# Patient Record
Sex: Male | Born: 1995 | Race: Black or African American | Hispanic: No | Marital: Single | Smoking: Current every day smoker
Health system: Southern US, Community
[De-identification: ages and names within clinical notes are randomized; demographics above are authoritative.]

## PROBLEM LIST (undated history)

## (undated) DIAGNOSIS — N289 Disorder of kidney and ureter, unspecified: Secondary | ICD-10-CM

## (undated) HISTORY — PX: EAR TUBE REMOVAL: SHX1486

---

## 2017-12-03 ENCOUNTER — Other Ambulatory Visit: Payer: Self-pay

## 2017-12-03 ENCOUNTER — Emergency Department (HOSPITAL_COMMUNITY): Payer: BLUE CROSS/BLUE SHIELD

## 2017-12-03 ENCOUNTER — Emergency Department (HOSPITAL_COMMUNITY)
Admission: EM | Admit: 2017-12-03 | Discharge: 2017-12-03 | Disposition: A | Discharge status: Home / Self Care | Payer: BLUE CROSS/BLUE SHIELD | Attending: Emergency Medicine | Admitting: Emergency Medicine

## 2017-12-03 ENCOUNTER — Encounter (HOSPITAL_COMMUNITY): Payer: Self-pay | Admitting: Emergency Medicine

## 2017-12-03 DIAGNOSIS — F17200 Nicotine dependence, unspecified, uncomplicated: Secondary | ICD-10-CM | POA: Insufficient documentation

## 2017-12-03 DIAGNOSIS — S92424A Nondisplaced fracture of distal phalanx of right great toe, initial encounter for closed fracture: Secondary | ICD-10-CM | POA: Diagnosis not present

## 2017-12-03 DIAGNOSIS — Y9389 Activity, other specified: Secondary | ICD-10-CM | POA: Diagnosis not present

## 2017-12-03 DIAGNOSIS — Y998 Other external cause status: Secondary | ICD-10-CM | POA: Insufficient documentation

## 2017-12-03 DIAGNOSIS — Y929 Unspecified place or not applicable: Secondary | ICD-10-CM | POA: Insufficient documentation

## 2017-12-03 DIAGNOSIS — S99921A Unspecified injury of right foot, initial encounter: Secondary | ICD-10-CM | POA: Diagnosis present

## 2017-12-03 DIAGNOSIS — W228XXA Striking against or struck by other objects, initial encounter: Secondary | ICD-10-CM | POA: Diagnosis not present

## 2017-12-03 MED ORDER — HYDROCODONE-ACETAMINOPHEN 5-325 MG PO TABS: 1.0000 | ORAL_TABLET | ORAL | 0 refills | Status: DC | PRN

## 2017-12-03 MED ORDER — IBUPROFEN 600 MG PO TABS: 600.0000 mg | ORAL_TABLET | Freq: Four times a day (QID) | ORAL | 0 refills | Status: DC

## 2017-12-03 NOTE — ED Provider Notes (Signed)
[facility] EMERGENCY DEPARTMENT Provider Note   CSN: [id number] Arrival date & time: 12/03/17  1200     History   Chief Complaint Chief Complaint  Patient presents with  . Foot Injury    HPI Nathan Gross is a 22 y.o. male.  Patient is a 22 year old male who presents to the emergency department with a complaint of right foot pain.  The patient states he was trying to kick an object out of his way, when he was enough kicking a break or stone and injuring his foot.  Patient states he has been having pain that is increasing, and feels as though it is going from his big toe up to the middle of his lower leg.  He presents now for evaluation of this issue.  No other injury reported.  No previous operations or procedures involving the right lower extremity.     History reviewed. No pertinent past medical history.  There are no active problems to display for this patient.   Past Surgical History:  Procedure Laterality Date  . EAR TUBE REMOVAL          Home Medications    Prior to Admission medications   Not on File    Family History History reviewed. No pertinent family history.  Social History Social History   Tobacco Use  . Smoking status: Current Every Day Smoker    Packs/day: 1.00  . Smokeless tobacco: Never Used  Substance Use Topics  . Alcohol use: Yes  . Drug use: Yes    Types: Marijuana    Comment: daily     Allergies   Patient has no known allergies.   Review of Systems Review of Systems  Constitutional: Negative for activity change.       All ROS Neg except as noted in HPI  HENT: Negative for nosebleeds.   Eyes: Negative for photophobia and discharge.  Respiratory: Negative for cough, shortness of breath and wheezing.   Cardiovascular: Negative for chest pain and palpitations.  Gastrointestinal: Negative for abdominal pain and blood in stool.  Genitourinary: Negative for dysuria, frequency and hematuria.  Musculoskeletal: Negative  for arthralgias, back pain and neck pain.       Foot pain  Skin: Negative.   Neurological: Negative for dizziness, seizures and speech difficulty.  Psychiatric/Behavioral: Negative for confusion and hallucinations.     Physical Exam Updated Vital Signs BP (!) 137/120 (BP Location: Right Arm)   Pulse 97   Temp 98.5 F (36.9 C) (Oral)   Resp 16   Ht 6' (1.829 m)   Wt 73.5 kg (162 lb)   SpO2 99%   BMI 21.97 kg/m   Physical Exam  Constitutional: He is oriented to person, place, and time. He appears well-developed and well-nourished.  Non-toxic appearance.  HENT:  Head: Normocephalic.  Right Ear: Tympanic membrane and external ear normal.  Left Ear: Tympanic membrane and external ear normal.  Eyes: Pupils are equal, round, and reactive to light. EOM and lids are normal.  Neck: Normal range of motion. Neck supple. Carotid bruit is not present.  Cardiovascular: Normal rate, regular rhythm, normal heart sounds, intact distal pulses and normal pulses.  Pulmonary/Chest: Breath sounds normal. No respiratory distress.  Abdominal: Soft. Bowel sounds are normal. There is no tenderness. There is no guarding.  Musculoskeletal: Normal range of motion.       Right foot: There is tenderness.       Feet:  Lymphadenopathy:       Head (right  side): No submandibular adenopathy present.       Head (left side): No submandibular adenopathy present.    He has no cervical adenopathy.  Neurological: He is alert and oriented to person, place, and time. He has normal strength. No cranial nerve deficit or sensory deficit.  Skin: Skin is warm and dry.  Psychiatric: He has a normal mood and affect. His speech is normal.  Nursing note and vitals reviewed.    ED Treatments / Results  Labs (all labs ordered are listed, but only abnormal results are displayed) Labs Reviewed - No data to display  EKG None  Radiology Dg Foot Complete Right  Result Date: 12/03/2017 CLINICAL DATA:  Patient states  that he kicked a brick yesterday and is now having right great toe pain that sometimes radiates up into lower leg. No prior known injuries. EXAM: RIGHT FOOT COMPLETE - 3+ VIEW COMPARISON:  None. FINDINGS: There is a linear lucency along the LATERAL aspect of the base of the distal phalanx of the great toe consistent with small avulsion injury. There is no dislocation or other fracture. Soft tissue swelling of the great toe. IMPRESSION: Fracture the base of the distal phalanx of the great toe. Electronically Signed   By: Norva Pavlov M.D.   On: 12/03/2017 12:47    Procedures Procedures (including critical care time) FRACTURE CARE. Patient kicked his foot against brick accidentally on yesterday and injured the right big toe.  X-ray reveals a fracture of the lateral aspect of the base of the distal phalanx.  I discussed the fracture with the patient in terms which he understands.  I discussed the immobilization process, and the patient gives permission for the process.  Patient identified by armband.  Procedural timeout taken.  The great toe and second toe were buddy taped by nursing staff.  A postoperative shoe was provided.  After the splint and postoperative shoe, the capillary refill is less than 2 seconds.  The patient denies that the splint feels too tight.  There are no temperature changes of the lower extremity.  Patient tolerated the procedure without problem.   Medications Ordered in ED Medications - No data to display   Initial Impression / Assessment and Plan / ED Course  I have reviewed the triage vital signs and the nursing notes.  Pertinent labs & imaging results that were available during my care of the patient were reviewed by me and considered in my medical decision making (see chart for details).       Final Clinical Impressions(s) / ED Diagnoses MDM  Vital signs within normal limits.  Pulse oximetry is 99% on room air.  Within normal limits by my interpretation.   There are no neurological vascular deficits appreciated.  The x-ray shows a fracture of the lateral aspect of the base of the distal phalanx of the great toe.  Buddy tape splint and postoperative shoe was provided with the patient.  Medication for pain was provided by the patient.  The patient is to follow-up with orthopedics.  Patient is in agreement with this plan.   Final diagnoses:  Nondisplaced fracture of distal phalanx of right great toe, initial encounter for closed fracture    ED Discharge Orders        Ordered    ibuprofen (ADVIL,MOTRIN) 600 MG tablet  4 times daily     12/03/17 1405    HYDROcodone-acetaminophen (NORCO/VICODIN) 5-325 MG tablet  Every 4 hours PRN     12/03/17 1405  Ivery Quale, PA-C 12/04/17 1610    Bethann Berkshire, MD 12/04/17 [phone]

## 2017-12-03 NOTE — Discharge Instructions (Addendum)
You have a fracture of your right big toe.  Please leave the toes buddy taped over the next 7 to 10 days.  Use your wooden shoe until you can safely wear your regular shoes.  Please call Dr. Romeo Apple for office appointment to ensure that this heals correctly.  Use ibuprofen with breakfast, lunch, dinner, and at bedtime.  Use Ultram for more severe pain.This medication may cause drowsiness. Please do not drink, drive, or participate in activity that requires concentration while taking this medication.

## 2017-12-03 NOTE — ED Triage Notes (Signed)
Kicked bricks yesterday  R great toe injury  Also complains of headache

## 2019-05-12 ENCOUNTER — Encounter (HOSPITAL_COMMUNITY): Payer: Self-pay | Admitting: Emergency Medicine

## 2019-05-12 ENCOUNTER — Emergency Department (HOSPITAL_COMMUNITY)
Admission: EM | Admit: 2019-05-12 | Discharge: 2019-05-12 | Disposition: A | Discharge status: Home / Self Care | Payer: BC Managed Care – PPO | Attending: Emergency Medicine | Admitting: Emergency Medicine

## 2019-05-12 ENCOUNTER — Other Ambulatory Visit: Payer: Self-pay

## 2019-05-12 ENCOUNTER — Emergency Department (HOSPITAL_COMMUNITY): Payer: BC Managed Care – PPO

## 2019-05-12 DIAGNOSIS — R040 Epistaxis: Secondary | ICD-10-CM | POA: Insufficient documentation

## 2019-05-12 DIAGNOSIS — Z87898 Personal history of other specified conditions: Secondary | ICD-10-CM

## 2019-05-12 DIAGNOSIS — F17210 Nicotine dependence, cigarettes, uncomplicated: Secondary | ICD-10-CM | POA: Diagnosis not present

## 2019-05-12 DIAGNOSIS — Z202 Contact with and (suspected) exposure to infections with a predominantly sexual mode of transmission: Secondary | ICD-10-CM | POA: Insufficient documentation

## 2019-05-12 LAB — HIV ANTIBODY (ROUTINE TESTING W REFLEX): HIV Screen 4th Generation wRfx: NONREACTIVE

## 2019-05-12 MED ORDER — AZITHROMYCIN 250 MG PO TABS
1000.0000 mg | ORAL_TABLET | Freq: Once | ORAL | Status: AC
  Administered 2019-05-12: 1000 mg via ORAL
  Filled 2019-05-12: qty 4

## 2019-05-12 MED ORDER — CEFTRIAXONE SODIUM 250 MG IJ SOLR
250.0000 mg | Freq: Once | INTRAMUSCULAR | Status: AC
  Administered 2019-05-12: 250 mg via INTRAMUSCULAR
  Filled 2019-05-12: qty 250

## 2019-05-12 NOTE — ED Provider Notes (Signed)
[facility] EMERGENCY DEPARTMENT Provider Note   CSN: [id number] Arrival date & time: 05/12/19  1131     History   Chief Complaint Chief Complaint  Patient presents with  . Exposure to STD  . Epistaxis    HPI Nathan Gross is a 23 y.o. male with no past medical history presenting with 2 complaints, the first being exposure to chlamydia. He reports unprotected sex with his childs mother, denies being a couple, however, had unprotected sex one week ago.  Was advised yesterday that she was diagnosed with chlamydia.  He denies any symptoms, specifically no penile dc, dysuria, fever, chills or abdominal or back pain. Secondly he fell several months ago, hitting his nose and sustained a suspected broken nose, but did not seek medical tx at the time.  Since this event,  He has had frequent nosebleeds, sometimes with nose blowing, other times spontaneously.  He denies any chronic nasal discharge and no problems with congestion.  Also denies any other unexplained bleeding or bruising.  He reports it will bleed "for a minute", then resolve without intervention, last episode occurred several days ago.     The history is provided by the patient.    History reviewed. No pertinent past medical history.  There are no active problems to display for this patient.   Past Surgical History:  Procedure Laterality Date  . EAR TUBE REMOVAL          Home Medications    Prior to Admission medications   Not on File    Family History No family history on file.  Social History Social History   Tobacco Use  . Smoking status: Current Every Day Smoker    Packs/day: 2.00    Types: Cigars  . Smokeless tobacco: Never Used  Substance Use Topics  . Alcohol use: Yes  . Drug use: Yes    Types: Marijuana    Comment: daily     Allergies   Patient has no known allergies.   Review of Systems Review of Systems  Constitutional: Negative for fever.  HENT: Positive for nosebleeds. Negative  for congestion and sore throat.   Eyes: Negative.   Respiratory: Negative for chest tightness and shortness of breath.   Cardiovascular: Negative for chest pain.  Gastrointestinal: Negative for abdominal pain and nausea.  Genitourinary: Negative.  Negative for discharge, dysuria, flank pain, penile pain, penile swelling, scrotal swelling and testicular pain.  Musculoskeletal: Negative for arthralgias, joint swelling and neck pain.  Skin: Negative.  Negative for rash and wound.  Neurological: Negative for dizziness, weakness, light-headedness, numbness and headaches.  Psychiatric/Behavioral: Negative.      Physical Exam Updated Vital Signs BP 125/79   Pulse 68   Temp 98.2 F (36.8 C) (Oral)   Resp 18   Ht 5\' 9"  (1.753 m)   Wt 73.9 kg   SpO2 100%   BMI 24.07 kg/m   Physical Exam Vitals signs and nursing note reviewed.  Constitutional:      Appearance: He is well-developed.  HENT:     Head: Normocephalic and atraumatic.     Nose: Nose normal. No nasal deformity, septal deviation, nasal tenderness, mucosal edema, congestion or rhinorrhea.     Right Nostril: No epistaxis or septal hematoma.     Left Nostril: No epistaxis or septal hematoma.     Comments: No obvious source of nosebleeds found.  Nose patent.  Slight distal rightward deviation of distal nose. No edema.  No sinus ttp. Eyes:  Conjunctiva/sclera: Conjunctivae normal.  Neck:     Musculoskeletal: Normal range of motion.  Cardiovascular:     Rate and Rhythm: Normal rate and regular rhythm.     Heart sounds: Normal heart sounds.  Pulmonary:     Effort: Pulmonary effort is normal.     Breath sounds: Normal breath sounds. No wheezing.  Abdominal:     General: Bowel sounds are normal.     Palpations: Abdomen is soft.     Tenderness: There is no abdominal tenderness.  Genitourinary:    Comments: Deferred.  Musculoskeletal: Normal range of motion.  Skin:    General: Skin is warm and dry.  Neurological:      Mental Status: He is alert.      ED Treatments / Results  Labs (all labs ordered are listed, but only abnormal results are displayed) Labs Reviewed  HIV ANTIBODY (ROUTINE TESTING W REFLEX)  RPR  GC/CHLAMYDIA PROBE AMP ([place]) NOT AT [facility]    EKG None  Radiology Dg Nasal Bones  Result Date: 05/12/2019 CLINICAL DATA:  Trauma 3 months prior with persistent epistaxis EXAM: NASAL BONES - 3+ VIEW COMPARISON:  None. FINDINGS: Frontal, left lateral, right lateral images obtained. There is no apparent fracture or dislocation. There is periosteal thickening along the left and right nasal bones consistent with healing from prior trauma. No blastic or lytic bone lesions are evident. There is hazy opacity in the frontal and ethmoid regions. The maxillary antra appear clear bilaterally. Nasal septum is midline. IMPRESSION: No acute fracture or dislocation. No destructive type lesion. Periosteal reaction along the nasal bones, likely due to healing response from prior trauma. Nasal septum midline. Hazy opacity likely due to a degree of sinus disease in the frontal and ethmoid regions. Maxillary antra appear clear. Electronically Signed   By: Lowella Grip III M.D.   On: 05/12/2019 14:46    Procedures Procedures (including critical care time)  Medications Ordered in ED Medications  cefTRIAXone (ROCEPHIN) injection 250 mg (250 mg Intramuscular Given 05/12/19 1448)  azithromycin (ZITHROMAX) tablet 1,000 mg (1,000 mg Oral Given 05/12/19 1447)     Initial Impression / Assessment and Plan / ED Course  I have reviewed the triage vital signs and the nursing notes.  Pertinent labs & imaging results that were available during my care of the patient were reviewed by me and considered in my medical decision making (see chart for details).        Pt with asymptomatic exposure to chlamydia, urine cx collected. Pt also desirous of HIV/syphilis screening.  He was tx with rocephin and zithromax. No  bony deformity per plain film imaging of nasal bones.  Evidence for healing fx.  No obvious source of intermittent bleeding on exam.  Referral to ENT for further eval if sx persist.  Discussed avoidance of sex x 1 week, safe sex info given.  Pt aware labs pending.  Final Clinical Impressions(s) / ED Diagnoses   Final diagnoses:  STD exposure  History of epistaxis    ED Discharge Orders    None       Landis Martins 05/13/19 1055    Wyvonnia Dusky, MD 05/14/19 1136

## 2019-05-12 NOTE — ED Triage Notes (Signed)
Pt states that his girlfriend test positive for a sti he has been having nose bleeds for the past couple of days. It is not bleeding at this time.

## 2019-05-12 NOTE — Discharge Instructions (Addendum)
You have been treated for both gonorrhea and chlamydia today as these infections frequently follow each other. Do not have sex for 1 week as this infection clears.     Your nasal xray is reassuring for no signs of deformity of your nasal bones from your injury.  Avoid rigorous blowing of your nose which can trigger bleeding.  You may want to try a gentle coating of vaseline in your nose (not too deep as discussed) using a qtip before bedtime to prevent drying.  Call Dr. Benjamine Mola for further evaluation of your symptoms if they persist.

## 2019-05-13 LAB — RPR: RPR Ser Ql: NONREACTIVE

## 2019-05-16 LAB — GC/CHLAMYDIA PROBE AMP (~~LOC~~) NOT AT ARMC
Chlamydia: NEGATIVE
Neisseria Gonorrhea: NEGATIVE

## 2019-07-14 ENCOUNTER — Emergency Department (HOSPITAL_COMMUNITY)
Admission: EM | Admit: 2019-07-14 | Discharge: 2019-07-14 | Disposition: A | Discharge status: Home / Self Care | Payer: BC Managed Care – PPO | Attending: Emergency Medicine | Admitting: Emergency Medicine

## 2019-07-14 ENCOUNTER — Other Ambulatory Visit: Payer: Self-pay

## 2019-07-14 ENCOUNTER — Encounter (HOSPITAL_COMMUNITY): Payer: Self-pay

## 2019-07-14 DIAGNOSIS — F17290 Nicotine dependence, other tobacco product, uncomplicated: Secondary | ICD-10-CM | POA: Insufficient documentation

## 2019-07-14 DIAGNOSIS — R519 Headache, unspecified: Secondary | ICD-10-CM | POA: Diagnosis present

## 2019-07-14 DIAGNOSIS — Z20822 Contact with and (suspected) exposure to covid-19: Secondary | ICD-10-CM | POA: Diagnosis not present

## 2019-07-14 HISTORY — DX: Disorder of kidney and ureter, unspecified: N28.9

## 2019-07-14 MED ORDER — DIPHENHYDRAMINE HCL 25 MG PO CAPS
25.0000 mg | ORAL_CAPSULE | Freq: Once | ORAL | Status: AC
  Administered 2019-07-14: 25 mg via ORAL
  Filled 2019-07-14: qty 1

## 2019-07-14 MED ORDER — KETOROLAC TROMETHAMINE 10 MG PO TABS
10.0000 mg | ORAL_TABLET | Freq: Once | ORAL | Status: AC
  Administered 2019-07-14: 22:00:00 10 mg via ORAL
  Filled 2019-07-14: qty 1

## 2019-07-14 MED ORDER — ONDANSETRON 8 MG PO TBDP
8.0000 mg | ORAL_TABLET | Freq: Once | ORAL | Status: AC
  Administered 2019-07-14: 22:00:00 8 mg via ORAL
  Filled 2019-07-14: qty 1

## 2019-07-14 MED ORDER — ONDANSETRON HCL 4 MG PO TABS: 4.0000 mg | ORAL_TABLET | Freq: Four times a day (QID) | ORAL | 0 refills | Status: DC

## 2019-07-14 NOTE — ED Provider Notes (Signed)
[facility] EMERGENCY DEPARTMENT Provider Note   CSN: [id number] Arrival date & time: 07/14/19  1843     History Chief Complaint  Patient presents with  . Headache    Nathan Gross is a 24 y.o. male.  HPI      Nathan Gross is a 24 y.o. male who presents to the Emergency Department complaining of waxing and waning headache for 2 days.  He describes a throbbing headache to his forehead that radiates to temples and to the top of his head.  Headache is worse with eye strain and exposure to bright lights. Some mild nausea and loss of appetite.  Headache has currently improved after taking ibuprofen prior to arrival here.  He states that he has been working long hours at his job and working multiple days without a day off.  He denies vomiting, fever, chills, neck pain or stiffness, and blurred vision.  He is also requesting a COVID test noting co workers have tested positive.     Past Medical History:  Diagnosis Date  . Renal disorder    Born with one kidney    There are no problems to display for this patient.   Past Surgical History:  Procedure Laterality Date  . EAR TUBE REMOVAL       No family history on file.  Social History   Tobacco Use  . Smoking status: Current Every Day Smoker    Packs/day: 2.00    Types: Cigars  . Smokeless tobacco: Never Used  Substance Use Topics  . Alcohol use: Yes  . Drug use: Yes    Types: Marijuana    Comment: daily    Home Medications Prior to Admission medications   Medication Sig Start Date End Date Taking? Authorizing Provider  ondansetron (ZOFRAN) 4 MG tablet Take 1 tablet (4 mg total) by mouth every 6 (six) hours. As needed for nausea 07/14/19   Pauline Aus, PA-C    Allergies    Patient has no known allergies.  Review of Systems   Review of Systems  Constitutional: Negative for activity change, appetite change and fever.  HENT: Negative for congestion, facial swelling, sore throat and trouble swallowing.     Eyes: Positive for photophobia. Negative for pain and visual disturbance.  Respiratory: Negative for cough and shortness of breath.   Cardiovascular: Negative for chest pain.  Gastrointestinal: Negative for abdominal pain, nausea and vomiting.  Genitourinary: Negative for dysuria.  Musculoskeletal: Negative for neck pain and neck stiffness.  Skin: Negative for rash and wound.  Neurological: Positive for headaches. Negative for dizziness, facial asymmetry, speech difficulty, weakness and numbness.  Psychiatric/Behavioral: Negative for confusion and decreased concentration.    Physical Exam Updated Vital Signs BP 135/86 (BP Location: Right Arm)   Pulse 63   Temp (!) 97.4 F (36.3 C) (Oral)   Resp 18   Ht 6' (1.829 m)   Wt 73.9 kg   SpO2 100%   BMI 22.11 kg/m   Physical Exam Vitals and nursing note reviewed.  Constitutional:      Appearance: He is well-developed. He is not ill-appearing.  HENT:     Mouth/Throat:     Mouth: Mucous membranes are moist.  Eyes:     Extraocular Movements: Extraocular movements intact.     Pupils: Pupils are equal, round, and reactive to light.  Neck:     Meningeal: Kernig's sign absent.  Cardiovascular:     Rate and Rhythm: Normal rate and regular rhythm.  Heart sounds: Normal heart sounds.  Pulmonary:     Effort: Pulmonary effort is normal.     Breath sounds: Normal breath sounds. No wheezing or rhonchi.  Musculoskeletal:        General: Normal range of motion.     Cervical back: Normal range of motion and neck supple.  Lymphadenopathy:     Cervical: No cervical adenopathy.  Skin:    General: Skin is warm.     Capillary Refill: Capillary refill takes less than 2 seconds.     Findings: No rash.  Neurological:     General: No focal deficit present.     Mental Status: He is alert.     Sensory: Sensation is intact.     Motor: Motor function is intact.     Coordination: Coordination is intact.     Gait: Gait is intact.     Comments:  CN II-XII intact.  Speech clear.       ED Results / Procedures / Treatments   Labs (all labs ordered are listed, but only abnormal results are displayed) Labs Reviewed  NOVEL CORONAVIRUS, NAA (HOSP ORDER, SEND-OUT TO REF LAB; TAT 18-24 HRS)    EKG None  Radiology No results found.  Procedures Procedures (including critical care time)  Medications Ordered in ED Medications  ondansetron (ZOFRAN-ODT) disintegrating tablet 8 mg (8 mg Oral Given 07/14/19 2156)  ketorolac (TORADOL) tablet 10 mg (10 mg Oral Given 07/14/19 2155)  diphenhydrAMINE (BENADRYL) capsule 25 mg (25 mg Oral Given 07/14/19 2155)    ED Course  I have reviewed the triage vital signs and the nursing notes.  Pertinent labs & imaging results that were available during my care of the patient were reviewed by me and considered in my medical decision making (see chart for details).    MDM Rules/Calculators/A&P                      Well appearing pt, NV intact.  No meningeal signs.  Ambulates in dept with steady gait.  Pt using an ipad when I entered the room.  No concerning sx's for meningitis, or thunder clap headache.     COVID test ordered at pt's request.  results pending.  He agrees to isolate at home per guidelines discussed pending results of his test.    Final Clinical Impression(s) / ED Diagnoses Final diagnoses:  Headache disorder    Rx / DC Orders ED Discharge Orders         Ordered    ondansetron (ZOFRAN) 4 MG tablet  Every 6 hours     07/14/19 2153           Kem Parkinson, PA-C 07/15/19 2035    Nat Christen, MD 07/15/19 2219

## 2019-07-14 NOTE — Discharge Instructions (Signed)
It is important that you get plenty of rest and eat regular meals.  Drink plenty of fluids.  Your Covid test is pending, you may review your results on MyChart in 24 to 48 hours.  Isolate at home until your test results are back, you may return to work if your results are negative.  If your results are positive you will need to quarantine at home for 10 days.  Follow-up with your primary doctor or return to the ER for any worsening symptoms.

## 2019-07-14 NOTE — ED Notes (Signed)
TT in to assess 

## 2019-07-14 NOTE — ED Triage Notes (Signed)
Pt presents to ED with complaints of headache x 2 days. Pt denies nausea and vomiting. Pt reports + light sensitivity.

## 2019-07-14 NOTE — ED Notes (Signed)
Pt reports a headache for the last 2 days   He is non photophobic Ambulates heel to toe  Neuro intact   Took ibuprofen last night nothing today   Here for eval

## 2019-07-16 LAB — NOVEL CORONAVIRUS, NAA (HOSP ORDER, SEND-OUT TO REF LAB; TAT 18-24 HRS): SARS-CoV-2, NAA: NOT DETECTED

## 2020-07-20 ENCOUNTER — Other Ambulatory Visit: Payer: BC Managed Care – PPO

## 2021-06-13 ENCOUNTER — Other Ambulatory Visit: Payer: Self-pay

## 2021-06-13 ENCOUNTER — Encounter (HOSPITAL_COMMUNITY): Payer: Self-pay

## 2021-06-13 ENCOUNTER — Emergency Department (HOSPITAL_COMMUNITY)
Admission: EM | Admit: 2021-06-13 | Discharge: 2021-06-14 | Disposition: A | Discharge status: Home / Self Care | Payer: BC Managed Care – PPO | Attending: Emergency Medicine | Admitting: Emergency Medicine

## 2021-06-13 DIAGNOSIS — S0992XA Unspecified injury of nose, initial encounter: Secondary | ICD-10-CM | POA: Diagnosis present

## 2021-06-13 DIAGNOSIS — F17210 Nicotine dependence, cigarettes, uncomplicated: Secondary | ICD-10-CM | POA: Insufficient documentation

## 2021-06-13 DIAGNOSIS — Z23 Encounter for immunization: Secondary | ICD-10-CM | POA: Diagnosis not present

## 2021-06-13 MED ORDER — AMOXICILLIN-POT CLAVULANATE 875-125 MG PO TABS
1.0000 | ORAL_TABLET | Freq: Once | ORAL | Status: AC
  Administered 2021-06-13: 1 via ORAL
  Filled 2021-06-13: qty 1

## 2021-06-13 MED ORDER — TETANUS-DIPHTH-ACELL PERTUSSIS 5-2.5-18.5 LF-MCG/0.5 IM SUSY
0.5000 mL | PREFILLED_SYRINGE | Freq: Once | INTRAMUSCULAR | Status: AC
  Administered 2021-06-13: 0.5 mL via INTRAMUSCULAR
  Filled 2021-06-13: qty 0.5
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
## 2021-06-14 ENCOUNTER — Encounter: Payer: Self-pay | Admitting: Plastic Surgery

## 2021-06-14 ENCOUNTER — Ambulatory Visit (INDEPENDENT_AMBULATORY_CARE_PROVIDER_SITE_OTHER): Payer: BC Managed Care – PPO | Admitting: Plastic Surgery

## 2021-06-14 DIAGNOSIS — S0121XA Laceration without foreign body of nose, initial encounter: Secondary | ICD-10-CM

## 2021-06-14 DIAGNOSIS — W503XXA Accidental bite by another person, initial encounter: Secondary | ICD-10-CM

## 2021-06-14 MED ORDER — AMOXICILLIN-POT CLAVULANATE 875-125 MG PO TABS: 1.0000 | ORAL_TABLET | Freq: Two times a day (BID) | ORAL | 0 refills | Status: AC

## 2021-06-14 NOTE — Discharge Instructions (Addendum)
You were evaluated in the Emergency Department and after careful evaluation, we did not find any emergent condition requiring admission or further testing in the hospital.  We have discussed her injury with the plastic surgeons, they would like to see you in the office tomorrow morning.  Do not eat anything until you see them.  Keep the bandage on until you see them.  Take the antibiotics as directed to prevent infection.  Please return to the Emergency Department if you experience any worsening of your condition.  Thank you for allowing [place] to be a part of your care.
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
## 2021-06-24 ENCOUNTER — Ambulatory Visit: Payer: BC Managed Care – PPO | Admitting: Plastic Surgery
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
## 2021-06-28 ENCOUNTER — Ambulatory Visit: Payer: BC Managed Care – PPO | Admitting: Plastic Surgery

## 2021-06-28 ENCOUNTER — Other Ambulatory Visit: Payer: Self-pay

## 2021-06-28 DIAGNOSIS — S0992XA Unspecified injury of nose, initial encounter: Secondary | ICD-10-CM | POA: Diagnosis not present

## 2021-06-28 DIAGNOSIS — W503XXD Accidental bite by another person, subsequent encounter: Secondary | ICD-10-CM | POA: Diagnosis not present

## 2021-07-01 ENCOUNTER — Encounter: Payer: Self-pay | Admitting: Plastic Surgery
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
## 2021-07-19 ENCOUNTER — Ambulatory Visit: Payer: BC Managed Care – PPO | Admitting: Plastic Surgery

## 2021-12-19 ENCOUNTER — Other Ambulatory Visit: Payer: Self-pay

## 2021-12-19 ENCOUNTER — Encounter (HOSPITAL_COMMUNITY): Payer: Self-pay | Admitting: *Deleted

## 2021-12-19 ENCOUNTER — Emergency Department (HOSPITAL_COMMUNITY)
Admission: EM | Admit: 2021-12-19 | Discharge: 2021-12-19 | Disposition: A | Discharge status: Home / Self Care | Payer: BC Managed Care – PPO | Source: Home / Self Care | Attending: Student | Admitting: Student

## 2021-12-19 ENCOUNTER — Emergency Department (HOSPITAL_COMMUNITY)
Admission: EM | Admit: 2021-12-19 | Discharge: 2021-12-19 | Discharge status: Against Medical Advice | Payer: BC Managed Care – PPO | Attending: Emergency Medicine | Admitting: Emergency Medicine

## 2021-12-19 ENCOUNTER — Encounter (HOSPITAL_COMMUNITY): Payer: Self-pay

## 2021-12-19 DIAGNOSIS — F17210 Nicotine dependence, cigarettes, uncomplicated: Secondary | ICD-10-CM | POA: Insufficient documentation

## 2021-12-19 DIAGNOSIS — K625 Hemorrhage of anus and rectum: Secondary | ICD-10-CM | POA: Insufficient documentation

## 2021-12-19 DIAGNOSIS — Z5321 Procedure and treatment not carried out due to patient leaving prior to being seen by health care provider: Secondary | ICD-10-CM | POA: Insufficient documentation

## 2021-12-19 DIAGNOSIS — R109 Unspecified abdominal pain: Secondary | ICD-10-CM | POA: Diagnosis present

## 2021-12-19 DIAGNOSIS — K921 Melena: Secondary | ICD-10-CM | POA: Insufficient documentation

## 2021-12-19 LAB — COMPREHENSIVE METABOLIC PANEL
ALT: 15 U/L (ref 0–44)
ALT: 16 U/L (ref 0–44)
AST: 27 U/L (ref 15–41)
AST: 20 U/L (ref 15–41)
Albumin: 4.2 g/dL (ref 3.5–5.0)
Albumin: 4.2 g/dL (ref 3.5–5.0)
Alkaline Phosphatase: 57 U/L (ref 38–126)
Alkaline Phosphatase: 57 U/L (ref 38–126)
Anion gap: 9 (ref 5–15)
Anion gap: 8 (ref 5–15)
BUN: 15 mg/dL (ref 6–20)
BUN: 14 mg/dL (ref 6–20)
CO2: 24 mmol/L (ref 22–32)
CO2: 24 mmol/L (ref 22–32)
Calcium: 9.7 mg/dL (ref 8.9–10.3)
Calcium: 9.3 mg/dL (ref 8.9–10.3)
Chloride: 106 mmol/L (ref 98–111)
Chloride: 106 mmol/L (ref 98–111)
Creatinine, Ser: 1.1 mg/dL (ref 0.61–1.24)
Creatinine, Ser: 0.97 mg/dL (ref 0.61–1.24)
GFR, Estimated: >60 mL/min (ref >60)
GFR, Estimated: >60 mL/min (ref >60)
Glucose, Bld: 91 mg/dL (ref 70–99)
Glucose, Bld: 78 mg/dL (ref 70–99)
Potassium: 5.2 mmol/L — ABNORMAL HIGH (ref 3.5–5.1)
Potassium: 3.9 mmol/L (ref 3.5–5.1)
Sodium: 139 mmol/L (ref 135–145)
Sodium: 138 mmol/L (ref 135–145)
Total Bilirubin: 1.1 mg/dL (ref 0.3–1.2)
Total Bilirubin: 0.3 mg/dL (ref 0.3–1.2)
Total Protein: 6.6 g/dL (ref 6.5–8.1)
Total Protein: 7 g/dL (ref 6.5–8.1)

## 2021-12-19 LAB — CBC WITH DIFFERENTIAL/PLATELET
Abs Immature Granulocytes: 0 10*3/uL (ref 0.00–0.07)
Basophils Absolute: 0.1 10*3/uL (ref 0.0–0.1)
Basophils Relative: 1 %
Eosinophils Absolute: 0.3 10*3/uL (ref 0.0–0.5)
Eosinophils Relative: 6 %
HCT: 42.9 % (ref 39.0–52.0)
Hemoglobin: 14.5 g/dL (ref 13.0–17.0)
Immature Granulocytes: 0 %
Lymphocytes Relative: 52 %
Lymphs Abs: 2.9 10*3/uL (ref 0.7–4.0)
MCH: 31 pg (ref 26.0–34.0)
MCHC: 33.8 g/dL (ref 30.0–36.0)
MCV: 91.9 fL (ref 80.0–100.0)
Monocytes Absolute: 0.3 10*3/uL (ref 0.1–1.0)
Monocytes Relative: 6 %
Neutro Abs: 1.9 10*3/uL (ref 1.7–7.7)
Neutrophils Relative %: 35 %
Platelets: 189 10*3/uL (ref 150–400)
RBC: 4.67 MIL/uL (ref 4.22–5.81)
RDW: 12.7 % (ref 11.5–15.5)
WBC: 5.5 10*3/uL (ref 4.0–10.5)
nRBC: 0 % (ref 0.0–0.2)

## 2021-12-19 LAB — URINALYSIS, ROUTINE W REFLEX MICROSCOPIC
Bacteria, UA: NONE SEEN
Bilirubin Urine: NEGATIVE
Glucose, UA: NEGATIVE mg/dL
Hgb urine dipstick: NEGATIVE
Ketones, ur: NEGATIVE mg/dL
Leukocytes,Ua: NEGATIVE
Nitrite: NEGATIVE
Protein, ur: NEGATIVE mg/dL
Specific Gravity, Urine: 1.021 (ref 1.005–1.030)
pH: 7 (ref 5.0–8.0)

## 2021-12-19 LAB — CBC
HCT: 43.1 % (ref 39.0–52.0)
Hemoglobin: 14.9 g/dL (ref 13.0–17.0)
MCH: 31.6 pg (ref 26.0–34.0)
MCHC: 34.6 g/dL (ref 30.0–36.0)
MCV: 91.3 fL (ref 80.0–100.0)
Platelets: 192 10*3/uL (ref 150–400)
RBC: 4.72 MIL/uL (ref 4.22–5.81)
RDW: 12.5 % (ref 11.5–15.5)
WBC: 6.7 10*3/uL (ref 4.0–10.5)
nRBC: 0 % (ref 0.0–0.2)

## 2021-12-19 LAB — POC OCCULT BLOOD, ED: Fecal Occult Bld: POSITIVE — AB

## 2021-12-19 LAB — PROTIME-INR
INR: 1.2 (ref 0.8–1.2)
Prothrombin Time: 15.1 seconds (ref 11.4–15.2)

## 2021-12-19 LAB — LIPASE, BLOOD: Lipase: 32 U/L (ref 11–51)

## 2021-12-19 NOTE — ED Triage Notes (Signed)
Pt c/o abdominal pain and dark red stools x 1 week. Pt seen for same. Pt denies nausea/vomiting.

## 2021-12-19 NOTE — ED Provider Notes (Signed)
[facility] EMERGENCY DEPARTMENT Provider Note  CSN: [id number] Arrival date & time: 12/19/21 3557  Chief Complaint(s) Rectal Bleeding  HPI Nathan Gross is a 26 y.o. male who presents emergency department for evaluation of rectal bleeding.  Patient states that over the last 1 week he has had persistent bright red blood per rectum.  He states that the rectal bleeding is painless and is primarily streaks of red blood with well-formed brown stool.  He denies constipation.  Denies blood thinner use, hematemesis, chest pain, shortness of breath, excessive fatigue or other systemic symptoms.  Of note he does endorse an involuntary 30 pound weight loss over the last 6 months but denies night sweats or family history of colon cancer.  Denies digital trauma to the anus or retained foreign body.   Past Medical History Past Medical History:  Diagnosis Date   Renal disorder    Born with one kidney   There are no problems to display for this patient.  Home Medication(s) Prior to Admission medications   Not on File                                                                                                                                    Past Surgical History Past Surgical History:  Procedure Laterality Date   EAR TUBE REMOVAL     Family History History reviewed. No pertinent family history.  Social History Social History   Tobacco Use   Smoking status: Every Day    Packs/day: 2.00    Types: Cigars, Cigarettes   Smokeless tobacco: Never  Vaping Use   Vaping Use: Never used  Substance Use Topics   Alcohol use: Yes   Drug use: Yes    Types: Marijuana    Comment: daily   Allergies Patient has no known allergies.  Review of Systems Review of Systems  Gastrointestinal:  Positive for anal bleeding and blood in stool.    Physical Exam Vital Signs  I have reviewed the triage vital signs BP (!) 132/94   Pulse (!) 59   Temp 98.1 F (36.7 C) (Oral)   Resp 20   Ht 5'  6" (1.676 m)   Wt 71.2 kg   SpO2 100%   BMI 25.34 kg/m   Physical Exam Constitutional:      General: He is not in acute distress.    Appearance: Normal appearance.  HENT:     Head: Normocephalic and atraumatic.     Nose: No congestion or rhinorrhea.  Eyes:     General:        Right eye: No discharge.        Left eye: No discharge.     Extraocular Movements: Extraocular movements intact.     Pupils: Pupils are equal, round, and reactive to light.  Cardiovascular:     Rate and Rhythm: Normal rate and regular rhythm.     Heart sounds: No  murmur heard. Pulmonary:     Effort: No respiratory distress.     Breath sounds: No wheezing or rales.  Abdominal:     General: There is no distension.     Tenderness: There is no abdominal tenderness.  Genitourinary:    Rectum: Guaiac result positive.     Comments: Suspected internal hemorrhoid on rectal exam Musculoskeletal:        General: Normal range of motion.     Cervical back: Normal range of motion.  Skin:    General: Skin is warm and dry.  Neurological:     General: No focal deficit present.     Mental Status: He is alert.     ED Results and Treatments Labs (all labs ordered are listed, but only abnormal results are displayed) Labs Reviewed  POC OCCULT BLOOD, ED - Abnormal; Notable for the following components:      Result Value   Fecal Occult Bld POSITIVE (*)    All other components within normal limits  CBC WITH DIFFERENTIAL/PLATELET  COMPREHENSIVE METABOLIC PANEL  PROTIME-INR                                                                                                                          Radiology No results found.  Pertinent labs & imaging results that were available during my care of the patient were reviewed by me and considered in my medical decision making (see MDM for details).  Medications Ordered in ED Medications - No data to display                                                                                                                                    Procedures Procedures  (including critical care time)  Medical Decision Making / ED Course   This patient presents to the ED for concern of bright red blood per rectum, this involves an extensive number of treatment options, and is a complaint that carries with it a high risk of complications and morbidity.  The differential diagnosis includes internal hemorrhoid, external hemorrhoid, rectal fissure, diverticulosis, malignancy, GI bleed  MDM: Patient seen emergency room for evaluation of rectal bleeding.  Physical exam with no external evidence of rectal fissure or external hemorrhoid, and internal rectal exam with suspected internal hemorrhoid and is Hemoccult positive.  Laboratory evaluation unremarkable with no significant anemia.  Patient is already on a PPI given  to the patient from an urgent care visit last week.  Suspect internal hemorrhoid, but in the setting of his weight loss, he should be evaluated by an outpatient GI physician to ensure we are not missing early onset colon cancer.  I placed an internal referral to GI and patient was discharged with GI follow-up.   Additional history obtained: -Additional history obtained from partner -External records from outside source obtained and reviewed including: Chart review including previous notes, labs, imaging, consultation notes   Lab Tests: -I ordered, reviewed, and interpreted labs.   The pertinent results include:   Labs Reviewed  POC OCCULT BLOOD, ED - Abnormal; Notable for the following components:      Result Value   Fecal Occult Bld POSITIVE (*)    All other components within normal limits  CBC WITH DIFFERENTIAL/PLATELET  COMPREHENSIVE METABOLIC PANEL  PROTIME-INR      Medicines ordered and prescription drug management: No orders of the defined types were placed in this encounter.   -I have reviewed the patients home medicines and have made  adjustments as needed  Critical interventions none    Cardiac Monitoring: The patient was maintained on a cardiac monitor.  I personally viewed and interpreted the cardiac monitored which showed an underlying rhythm of: NSR  Social Determinants of Health:  Factors impacting patients care include: none   Reevaluation: After the interventions noted above, I reevaluated the patient and found that they have :stayed the same  Co morbidities that complicate the patient evaluation  Past Medical History:  Diagnosis Date   Renal disorder    Born with one kidney      Dispostion: I considered admission for this patient, but current leave the patient does not meet inpatient criteria for admission and is safe for discharge with outpatient GI follow-up     Final Clinical Impression(s) / ED Diagnoses Final diagnoses:  Rectal bleeding     @PCDICTATION @    , MD 12/19/21 1105

## 2021-12-19 NOTE — ED Triage Notes (Signed)
Pt c/o dark red blood in stools x one week; pt c/o some abdominal pain

## 2021-12-26 ENCOUNTER — Ambulatory Visit: Payer: BC Managed Care – PPO | Admitting: Gastroenterology

## 2023-10-23 ENCOUNTER — Other Ambulatory Visit: Payer: Self-pay

## 2023-10-23 ENCOUNTER — Encounter (HOSPITAL_COMMUNITY): Payer: Self-pay

## 2023-10-23 ENCOUNTER — Emergency Department (HOSPITAL_COMMUNITY)
Admission: EM | Admit: 2023-10-23 | Discharge: 2023-10-23 | Disposition: A | Discharge status: Home / Self Care | Payer: Self-pay | Attending: Emergency Medicine | Admitting: Emergency Medicine

## 2023-10-23 DIAGNOSIS — F419 Anxiety disorder, unspecified: Secondary | ICD-10-CM | POA: Insufficient documentation

## 2023-10-23 DIAGNOSIS — T43625A Adverse effect of amphetamines, initial encounter: Secondary | ICD-10-CM | POA: Insufficient documentation

## 2023-10-23 LAB — BASIC METABOLIC PANEL WITH GFR
Anion gap: 8 (ref 5–15)
BUN: 10 mg/dL (ref 6–20)
CO2: 24 mmol/L (ref 22–32)
Calcium: 9.5 mg/dL (ref 8.9–10.3)
Chloride: 105 mmol/L (ref 98–111)
Creatinine, Ser: 0.93 mg/dL (ref 0.61–1.24)
GFR, Estimated: >60 mL/min (ref >60)
Glucose, Bld: 97 mg/dL (ref 70–99)
Potassium: 3.8 mmol/L (ref 3.5–5.1)
Sodium: 137 mmol/L (ref 135–145)

## 2023-10-23 LAB — CBC
HCT: 47.2 % (ref 39.0–52.0)
Hemoglobin: 16 g/dL (ref 13.0–17.0)
MCH: 30.8 pg (ref 26.0–34.0)
MCHC: 33.9 g/dL (ref 30.0–36.0)
MCV: 90.9 fL (ref 80.0–100.0)
Platelets: 221 10*3/uL (ref 150–400)
RBC: 5.19 MIL/uL (ref 4.22–5.81)
RDW: 13.3 % (ref 11.5–15.5)
WBC: 6.5 10*3/uL (ref 4.0–10.5)
nRBC: 0 % (ref 0.0–0.2)

## 2023-10-23 LAB — RAPID URINE DRUG SCREEN, HOSP PERFORMED
Amphetamines: POSITIVE — AB
Barbiturates: NOT DETECTED
Benzodiazepines: NOT DETECTED
Cocaine: NOT DETECTED
Opiates: NOT DETECTED
Tetrahydrocannabinol: POSITIVE — AB

## 2023-10-23 LAB — TROPONIN I (HIGH SENSITIVITY): Troponin I (High Sensitivity): 3 ng/L (ref <18)

## 2023-10-23 NOTE — ED Provider Notes (Signed)
 [place] EMERGENCY DEPARTMENT AT [facility] Provider Note   CSN: [id number] Arrival date & time: 10/23/23  1034     History  Chief Complaint  Patient presents with   Anxiety    Nathan Gross is a 28 y.o. male with no significant past medical history presenting for anxiety which started yesterday after smoking marijuana and drinking about 6 shots of liquor.  He describes anxiety,  pacing the floor and inability to concentrate since his sx began.  He denies confusion,  headache,  injury or fall, also denies chest pain, n/v, abd pain. He did not comment when asked if his marijuana supplier was someone new or not trusted source.    The history is provided by the patient.       Home Medications Prior to Admission medications   Not on File      Allergies    Patient has no known allergies.    Review of Systems   Review of Systems  Constitutional:  Negative for fever.  HENT:  Negative for congestion and sore throat.   Eyes: Negative.   Respiratory:  Negative for chest tightness and shortness of breath.   Cardiovascular:  Negative for chest pain.  Gastrointestinal:  Negative for abdominal pain and nausea.  Genitourinary: Negative.   Musculoskeletal:  Negative for arthralgias, joint swelling and neck pain.  Skin: Negative.  Negative for rash and wound.  Neurological:  Negative for dizziness, weakness, light-headedness, numbness and headaches.  Psychiatric/Behavioral:  The patient is nervous/anxious.     Physical Exam Updated Vital Signs BP 121/85 (BP Location: Left Arm)   Pulse 64   Temp 98.4 F (36.9 C) (Oral)   Resp (!) 22   Ht 5\' 6"  (1.676 m)   Wt 76.7 kg   SpO2 100%   BMI 27.28 kg/m  Physical Exam Vitals and nursing note reviewed.  Constitutional:      Appearance: He is well-developed.  HENT:     Head: Normocephalic and atraumatic.  Eyes:     Conjunctiva/sclera: Conjunctivae normal.  Cardiovascular:     Rate and Rhythm: Normal rate and  regular rhythm.     Heart sounds: Normal heart sounds.  Pulmonary:     Effort: Pulmonary effort is normal.     Breath sounds: Normal breath sounds. No wheezing.  Abdominal:     General: Bowel sounds are normal.     Palpations: Abdomen is soft.     Tenderness: There is no abdominal tenderness.  Musculoskeletal:        General: Normal range of motion.     Cervical back: Normal range of motion.  Skin:    General: Skin is warm and dry.  Neurological:     General: No focal deficit present.     Mental Status: He is alert and oriented to person, place, and time.     Gait: Gait normal.  Psychiatric:        Mood and Affect: Mood is anxious.        Speech: Speech normal.        Behavior: Behavior is cooperative.        Thought Content: Thought content normal.        Cognition and Memory: Cognition normal.     ED Results / Procedures / Treatments   Labs (all labs ordered are listed, but only abnormal results are displayed) Labs Reviewed  RAPID URINE DRUG SCREEN, HOSP PERFORMED - Abnormal; Notable for the following components:  Result Value   Amphetamines POSITIVE (*)    Tetrahydrocannabinol POSITIVE (*)    All other components within normal limits  CBC  BASIC METABOLIC PANEL WITH GFR  TROPONIN I (HIGH SENSITIVITY)    EKG None  Radiology No results found.  Procedures Procedures    Medications Ordered in ED Medications - No data to display  ED Course/ Medical Decision Making/ A&P                                 Medical Decision Making Pt with suspected adverse ingestion,  pt denies any other substance use besides marijuana and etoh.  Physically is well,  anxious, but a&O x 3,  no focal neuro deficits.  Describes palpitations but normal rate per vitals and on exam. No cp,  no sob, no appetite but has ate today. No n/v.   Amount and/or Complexity of Data Reviewed Labs: ordered.    Details: CBC and bmet are normal, UDS positive for amphetamines and THC ECG/medicine  tests: ordered.    Details: Strain pattern,  not acute MI.  Reviewed by Dr. Bryna Car, EKG would not cross over from [facility].  Rate 69,  LVH.            Final Clinical Impression(s) / ED Diagnoses Final diagnoses:  Adverse effect of amphetamine, initial encounter    Rx / DC Orders ED Discharge Orders     None         Conal Shetley, PA-C 10/23/23 1608    Cheyenne Cotta, MD 10/27/23 1049

## 2023-10-23 NOTE — ED Triage Notes (Signed)
 Pt reports he "smoked some weed and drank some liquor last night" and thinks it may have been laced because he is feeling very anxious, short of breath ever since.

## 2023-10-23 NOTE — Discharge Instructions (Signed)
 Your drug screen has come back positive for amphetamines in addition to the marijuana, suspect that your marijuana may have been laced with his other chemical.  I recommend being careful about who you purchase your marijuana from to avoid future complications and possible bad outcome.

## 2023-12-14 ENCOUNTER — Encounter (HOSPITAL_COMMUNITY): Payer: Self-pay | Admitting: Emergency Medicine

## 2023-12-14 ENCOUNTER — Emergency Department (HOSPITAL_COMMUNITY)
Admission: EM | Admit: 2023-12-14 | Discharge: 2023-12-14 | Disposition: A | Discharge status: Home / Self Care | Payer: Self-pay | Attending: Emergency Medicine | Admitting: Emergency Medicine

## 2023-12-14 ENCOUNTER — Other Ambulatory Visit: Payer: Self-pay

## 2023-12-14 DIAGNOSIS — H1032 Unspecified acute conjunctivitis, left eye: Secondary | ICD-10-CM | POA: Insufficient documentation

## 2023-12-14 MED ORDER — POLYMYXIN B-TRIMETHOPRIM 10000-0.1 UNIT/ML-% OP SOLN
1.0000 [drp] | Freq: Once | OPHTHALMIC | Status: AC
  Administered 2023-12-14: 1 [drp] via OPHTHALMIC
  Filled 2023-12-14: qty 10

## 2023-12-14 MED ORDER — FLUORESCEIN SODIUM 1 MG OP STRP
1.0000 | ORAL_STRIP | Freq: Once | OPHTHALMIC | Status: AC
  Administered 2023-12-14: 1 via OPHTHALMIC
  Filled 2023-12-14: qty 1

## 2023-12-14 MED ORDER — TETRACAINE HCL 0.5 % OP SOLN
2.0000 [drp] | Freq: Once | OPHTHALMIC | Status: AC
  Administered 2023-12-14: 2 [drp] via OPHTHALMIC
  Filled 2023-12-14: qty 4

## 2023-12-14 NOTE — ED Provider Notes (Signed)
 [place] EMERGENCY DEPARTMENT AT [facility] Provider Note   CSN: [id number] Arrival date & time: 12/14/23  4098     History  Chief Complaint  Patient presents with   Eye Pain    Nathan Gross is a 28 y.o. male.  Aside from having a solitary kidney he denies any chronic medical issues.  He presents the ER today for evaluation of left eye redness and pain.  This started 3 days ago.  He states he woke up like that the eye felt gritty.  He has been rinsing the eye and using eyedrops with temporary relief.  He states he sometimes gets some mild blurry vision which clears with rinsing the eye or blinking several times.  There is no injury to the eye.  No fever or chills, minimal swelling below the left eye but no other periorbital swelling.  He does have a small child but denies any known sick contacts.    Eye Pain       Home Medications Prior to Admission medications   Not on File      Allergies    Patient has no known allergies.    Review of Systems   Review of Systems  Eyes:  Positive for pain.    Physical Exam Updated Vital Signs BP 122/81   Pulse (!) 57   Temp 97.7 F (36.5 C) (Oral)   Resp 16   Ht 5' 6.5" (1.689 m)   Wt 76.7 kg   SpO2 99%   BMI 26.87 kg/m  Physical Exam Vitals and nursing note reviewed.  Constitutional:      General: He is not in acute distress.    Appearance: He is well-developed.  HENT:     Head: Normocephalic and atraumatic.     Mouth/Throat:     Mouth: Mucous membranes are moist.  Eyes:     General: Lids are normal.        Left eye: Discharge present.No foreign body or hordeolum.     Intraocular pressure: Left eye pressure is 16 mmHg. Measurements were taken using a handheld tonometer.    Extraocular Movements: Extraocular movements intact.     Right eye: Normal extraocular motion.     Left eye: Normal extraocular motion.     Conjunctiva/sclera:     Left eye: Left conjunctiva is injected. No chemosis, exudate or  hemorrhage.    Pupils: Pupils are equal, round, and reactive to light.     Left eye: No corneal abrasion or fluorescein uptake. Seidel exam negative.    Slit lamp exam:    Left eye: No foreign body or photophobia.  Cardiovascular:     Rate and Rhythm: Normal rate and regular rhythm.     Heart sounds: No murmur heard. Pulmonary:     Effort: Pulmonary effort is normal. No respiratory distress.     Breath sounds: Normal breath sounds.  Abdominal:     Palpations: Abdomen is soft.     Tenderness: There is no abdominal tenderness.  Musculoskeletal:        General: No swelling.     Cervical back: Neck supple.  Skin:    General: Skin is warm and dry.     Capillary Refill: Capillary refill takes less than 2 seconds.  Neurological:     General: No focal deficit present.     Mental Status: He is alert and oriented to person, place, and time.  Psychiatric:        Mood and Affect: Mood  normal.     ED Results / Procedures / Treatments   Labs (all labs ordered are listed, but only abnormal results are displayed) Labs Reviewed - No data to display  EKG None  Radiology No results found.  Procedures Procedures    Medications Ordered in ED Medications  tetracaine (PONTOCAINE) 0.5 % ophthalmic solution 2 drop (2 drops Left Eye Given by Other 12/14/23 1308)  fluorescein ophthalmic strip 1 strip (1 strip Left Eye Given by Other 12/14/23 0859)  trimethoprim-polymyxin b (POLYTRIM) ophthalmic solution 1 drop (1 drop Left Eye Given 12/14/23 0941)    ED Course/ Medical Decision Making/ A&P                                 Medical Decision Making This patient presents to the ED for concern of left eye drainage X3 days, this involves an extensive number of treatment options, and is a complaint that carries with it a high risk of complications and morbidity.  The differential diagnosis includes bacterial conjunctivitis, viral conjunctivitis, foreign body, corneal abrasion, corneal ulcer,  periorbital cellulitis, orbital cellulitis, iritis, glaucoma, other   Co morbidities that complicate the patient evaluation :   None   Additional history obtained:  Additional history obtained from MR External records from outside source obtained and reviewed including prior notes   Problem List / ED Course / Critical interventions / Medication management  Redness and drainage-patient's presentation is consistent with likely bacterial conjunctivitis.  Is a mild stuffy nose but that started after the drainage, no fevers or chills, mild intermittent blurry vision that resolves with blinking or rinsing the eye.  He is not a contact lens wearer and does not wear any corrective lenses otherwise.  No trauma to the eye.  Fluorescein exam showed no uptake.  Will plan on treating with Polytrim drops every 3 hours while awake and have him follow-up with optometry.  I have reviewed the patients home medicines and have made adjustments as needed     Risk Prescription drug management.           Final Clinical Impression(s) / ED Diagnoses Final diagnoses:  None    Rx / DC Orders ED Discharge Orders     None         Aimee Houseman, PA-C 12/14/23 0945    Teddi Favors, DO 12/15/23 [phone]

## 2023-12-14 NOTE — ED Triage Notes (Signed)
 Pt reports left eye pain, swelling and drainage since Friday, denies injury

## 2023-12-14 NOTE — Discharge Instructions (Addendum)
 It was a pleasure taking care of you today.  You were seen for redness and drainage of your left eye.  This is most likely due to a bacterial eye infection or "pinkeye".  We are giving you drops.  Put 1 drop in your left eye every 3 hours while awake for 7 to 10 days.  You should start having improvement in the next couple of days.  Come back to the ER if you have increased pain, fever, blurry vision, increased swelling of your eye or other worrisome changes.  Otherwise follow-up with your PCP.  If you are not having improvement over the next several days you should schedule appointment with the eye doctor as listed.  Continue using cool compressed on your eye for comfort. Wash your hands frequently

## 2023-12-15 ENCOUNTER — Emergency Department (HOSPITAL_COMMUNITY): Payer: Self-pay

## 2023-12-15 ENCOUNTER — Emergency Department (HOSPITAL_COMMUNITY)
Admission: EM | Admit: 2023-12-15 | Discharge: 2023-12-15 | Disposition: A | Discharge status: Home / Self Care | Payer: Self-pay | Attending: Emergency Medicine | Admitting: Emergency Medicine

## 2023-12-15 ENCOUNTER — Encounter (HOSPITAL_COMMUNITY): Payer: Self-pay

## 2023-12-15 ENCOUNTER — Other Ambulatory Visit: Payer: Self-pay

## 2023-12-15 DIAGNOSIS — R519 Headache, unspecified: Secondary | ICD-10-CM | POA: Insufficient documentation

## 2023-12-15 DIAGNOSIS — L03213 Periorbital cellulitis: Secondary | ICD-10-CM | POA: Insufficient documentation

## 2023-12-15 DIAGNOSIS — H6123 Impacted cerumen, bilateral: Secondary | ICD-10-CM | POA: Insufficient documentation

## 2023-12-15 LAB — CBC WITH DIFFERENTIAL/PLATELET
Abs Immature Granulocytes: 0.02 10*3/uL (ref 0.00–0.07)
Basophils Absolute: 0 10*3/uL (ref 0.0–0.1)
Basophils Relative: 0 %
Eosinophils Absolute: 0.4 10*3/uL (ref 0.0–0.5)
Eosinophils Relative: 4 %
HCT: 44.9 % (ref 39.0–52.0)
Hemoglobin: 15.5 g/dL (ref 13.0–17.0)
Immature Granulocytes: 0 %
Lymphocytes Relative: 21 %
Lymphs Abs: 1.7 10*3/uL (ref 0.7–4.0)
MCH: 31.6 pg (ref 26.0–34.0)
MCHC: 34.5 g/dL (ref 30.0–36.0)
MCV: 91.4 fL (ref 80.0–100.0)
Monocytes Absolute: 0.7 10*3/uL (ref 0.1–1.0)
Monocytes Relative: 9 %
Neutro Abs: 5.5 10*3/uL (ref 1.7–7.7)
Neutrophils Relative %: 66 %
Platelets: 170 10*3/uL (ref 150–400)
RBC: 4.91 MIL/uL (ref 4.22–5.81)
RDW: 12.8 % (ref 11.5–15.5)
WBC: 8.4 10*3/uL (ref 4.0–10.5)
nRBC: 0 % (ref 0.0–0.2)

## 2023-12-15 LAB — BASIC METABOLIC PANEL WITH GFR
Anion gap: 8 (ref 5–15)
BUN: 8 mg/dL (ref 6–20)
CO2: 23 mmol/L (ref 22–32)
Calcium: 9 mg/dL (ref 8.9–10.3)
Chloride: 106 mmol/L (ref 98–111)
Creatinine, Ser: 1.03 mg/dL (ref 0.61–1.24)
GFR, Estimated: >60 mL/min (ref >60)
Glucose, Bld: 88 mg/dL (ref 70–99)
Potassium: 4 mmol/L (ref 3.5–5.1)
Sodium: 137 mmol/L (ref 135–145)

## 2023-12-15 MED ORDER — KETOROLAC TROMETHAMINE 15 MG/ML IJ SOLN
15.0000 mg | Freq: Once | INTRAMUSCULAR | Status: AC
  Administered 2023-12-15: 15 mg via INTRAVENOUS
  Filled 2023-12-15: qty 1

## 2023-12-15 MED ORDER — IOHEXOL 300 MG/ML  SOLN
75.0000 mL | Freq: Once | INTRAMUSCULAR | Status: AC | PRN
Start: 2023-12-15 — End: 2023-12-15
  Administered 2023-12-15: 75 mL via INTRAVENOUS

## 2023-12-15 MED ORDER — AMOXICILLIN-POT CLAVULANATE 875-125 MG PO TABS
1.0000 | ORAL_TABLET | Freq: Once | ORAL | Status: AC
  Administered 2023-12-15: 1 via ORAL
  Filled 2023-12-15: qty 1

## 2023-12-15 MED ORDER — AMOXICILLIN-POT CLAVULANATE 875-125 MG PO TABS: 1.0000 | ORAL_TABLET | Freq: Once | ORAL | Status: DC

## 2023-12-15 MED ORDER — CARBAMIDE PEROXIDE 6.5 % OT SOLN: 5.0000 [drp] | Freq: Two times a day (BID) | OTIC | 0 refills | Status: AC

## 2023-12-15 MED ORDER — AMOXICILLIN-POT CLAVULANATE 875-125 MG PO TABS: 1.0000 | ORAL_TABLET | Freq: Two times a day (BID) | ORAL | 0 refills | Status: AC

## 2023-12-15 MED ORDER — SULFAMETHOXAZOLE-TRIMETHOPRIM 800-160 MG PO TABS: 1.0000 | ORAL_TABLET | Freq: Two times a day (BID) | ORAL | 0 refills | Status: AC

## 2023-12-15 MED ORDER — NAPROXEN 500 MG PO TABS: 500.0000 mg | ORAL_TABLET | Freq: Two times a day (BID) | ORAL | 0 refills | Status: DC | PRN

## 2023-12-15 MED ORDER — SULFAMETHOXAZOLE-TRIMETHOPRIM 800-160 MG PO TABS
1.0000 | ORAL_TABLET | Freq: Once | ORAL | Status: AC
  Administered 2023-12-15: 1 via ORAL
  Filled 2023-12-15: qty 1

## 2023-12-15 NOTE — Discharge Instructions (Addendum)
 Pleasure taking care of you today.  You are seen for left eye swelling.  Your blood work was reassuring.  Your CT scan showed you likely have a clogged tear duct on the left causing you to have an infection of the skin around your eyes.  We are prescribing you oral antibiotics.  Make sure you take both antibiotics and finish the full course.  You can use warm compresses as well to help encourage drainage from the tear duct.  I have prescribed naproxen you can take as needed for pain.  He can also take over-the-counter Tylenol  as needed for discomfort.  Make sure you follow-up with the eye doctor.  Come back to the ER for new or worsening symptoms.  Please also continue using the eyedrops as prescribed yesterday.  Your CT scan also noted some debris in your ear canals, I am able to see that they are blocked with earwax.  I have prescribed drops to help clear the earwax.  Do not use Q-tips.  Follow-up with your PCP.

## 2023-12-15 NOTE — ED Triage Notes (Signed)
 Pt arrived via POV c/o recurring left eye swelling and reports he is having difficulty hearing as well. Pt reports he was prescribed eye drops, but due to swelling, he is unsure if he is applying them or not. Pt reports on-going blurry vision as well.

## 2023-12-15 NOTE — ED Provider Notes (Signed)
 [place] EMERGENCY DEPARTMENT AT [facility] Provider Note   CSN: [id number] Arrival date & time: 12/15/23  1032     History  Chief Complaint  Patient presents with   Facial Swelling    Nathan Gross is a 28 y.o. male.  Presents ER today for worsening left eye redness, pain and swelling.  He was seen in the emergency room yesterday for red eye, irritation and drainage and was seen personally by me.  He had normal intraocular pressure, normal vision, no fluorescein uptake and no significant periorbital swelling and no systemic symptoms.  He was sent home on Polytrim drops.  He states last night at 7 PM he started having worsening swelling and pain, he developed a headache is having some pain with extraocular movements as well.  He denies fever or chills.  Denies any interval trauma.  He comes back today for these worsening symptoms.   He is not a contact lens wearer.  HPI     Home Medications Prior to Admission medications   Medication Sig Start Date End Date Taking? Authorizing Provider  amoxicillin -clavulanate (AUGMENTIN ) 875-125 MG tablet Take 1 tablet by mouth every 12 (twelve) hours for 7 days. 12/15/23 12/22/23 Yes Caliya Narine A, PA-C  carbamide peroxide (DEBROX) 6.5 % OTIC solution Place 5 drops into both ears 2 (two) times daily for 4 days. 12/15/23 12/19/23 Yes Ona Rathert A, PA-C  naproxen (NAPROSYN) 500 MG tablet Take 1 tablet (500 mg total) by mouth 2 (two) times daily as needed for moderate pain (pain score 4-6) or mild pain (pain score 1-3). 12/15/23  Yes Kaymarie Wynn A, PA-C  sulfamethoxazole-trimethoprim (BACTRIM DS) 800-160 MG tablet Take 1 tablet by mouth 2 (two) times daily for 7 days. 12/15/23 12/22/23 Yes Danish Ruffins A, PA-C      Allergies    Patient has no known allergies.    Review of Systems   Review of Systems  Physical Exam Updated Vital Signs BP (!) 144/91 (BP Location: Right Arm)   Pulse 68   Temp (!) 97.5 F (36.4 C)  (Oral)   Resp 16   Ht 5' 6.5" (1.689 m)   Wt 76.7 kg   SpO2 100%   BMI 26.87 kg/m  Physical Exam Vitals and nursing note reviewed.  Constitutional:      General: He is not in acute distress.    Appearance: He is well-developed.  HENT:     Head: Normocephalic and atraumatic.  Eyes:     Extraocular Movements: Extraocular movements intact.     Conjunctiva/sclera: Conjunctivae normal.     Pupils: Pupils are equal, round, and reactive to light.  Cardiovascular:     Rate and Rhythm: Normal rate and regular rhythm.     Heart sounds: No murmur heard. Pulmonary:     Effort: Pulmonary effort is normal. No respiratory distress.     Breath sounds: Normal breath sounds.  Abdominal:     Palpations: Abdomen is soft.     Tenderness: There is no abdominal tenderness.  Musculoskeletal:        General: No swelling.     Cervical back: Neck supple.  Skin:    General: Skin is warm and dry.     Capillary Refill: Capillary refill takes less than 2 seconds.  Neurological:     General: No focal deficit present.     Mental Status: He is alert and oriented to person, place, and time.  Psychiatric:        Mood  and Affect: Mood normal.     ED Results / Procedures / Treatments   Labs (all labs ordered are listed, but only abnormal results are displayed) Labs Reviewed  CBC WITH DIFFERENTIAL/PLATELET  BASIC METABOLIC PANEL WITH GFR    EKG None  Radiology CT Orbits W Contrast Result Date: 12/15/2023 CLINICAL DATA:  Provided history: Periorbital cellulitis. Additional history provided: Recurring left eye swelling, blurry vision, difficulty hearing. EXAM: CT ORBITS WITH CONTRAST TECHNIQUE: Multidetector CT images was performed according to the standard protocol following intravenous contrast administration. RADIATION DOSE REDUCTION: This exam was performed according to the departmental dose-optimization program which includes automated exposure control, adjustment of the mA and/or kV according to  patient size and/or use of iterative reconstruction technique. CONTRAST:  75mL OMNIPAQUE IOHEXOL 300 MG/ML  SOLN COMPARISON:  Nasal bone radiographs 05/12/2019. FINDINGS: Orbits: 9 mm centrally hypodense and peripherally enhancing lesion in the left medial canthal region likely reflecting dacryocystitis (series 3, image 34). Left periorbital soft tissue swelling, edema and enhancement compatible with periorbital cellulitis. No CT evidence of post-septal orbital extension of infection. Unremarkable appearance of the right orbit. Visible paranasal sinuses: Minimal mucosal thickening within the left maxillary and left ethmoid sinus. Soft tissues: Left periorbital soft tissue swelling, edema and enhancement. No superimposed abscess. Osseous: No acute maxillofacial fracture or aggressive osseous lesion. Limited intracranial: No evidence of an acute intracranial abnormality within the field of view. Other: Partial opacification of the external auditory canals bilaterally. IMPRESSION: 1. 9 mm peripherally enhancing lesion in the left medial canthal region likely reflecting dacryocystitis. Left periorbital soft tissue swelling, edema and enhancement compatible with periorbital cellulitis. No CT evidence of post-septal orbital extension of infection. 2. Minor left maxillary and left ethmoid sinus mucosal thickening. 3. Nonspecific partial opacification of the external auditory canals, bilaterally. Direct visualization recommended. Electronically Signed   By: Bascom Lily D.O.   On: 12/15/2023 13:15    Procedures Procedures    Medications Ordered in ED Medications  ketorolac  (TORADOL ) 15 MG/ML injection 15 mg (15 mg Intravenous Given 12/15/23 1133)  iohexol (OMNIPAQUE) 300 MG/ML solution 75 mL (75 mLs Intravenous Contrast Given 12/15/23 1242)  amoxicillin -clavulanate (AUGMENTIN ) 875-125 MG per tablet 1 tablet (1 tablet Oral Given 12/15/23 1344)  sulfamethoxazole-trimethoprim (BACTRIM DS) 800-160 MG per tablet 1  tablet (1 tablet Oral Given 12/15/23 1344)    ED Course/ Medical Decision Making/ A&P                                 Medical Decision Making This patient presents to the ED for concern of left eye swelling, this involves an extensive number of treatment options, and is a complaint that carries with it a high risk of complications and morbidity.  The differential diagnosis includes periorbital cellulitis, orbital cellulitis, dermatitis, allergic reaction, other.     Additional history obtained:  Additional history obtained from EMR External records from outside source obtained and reviewed including prior  notes   Lab Tests:  I Ordered, and personally interpreted labs.  The pertinent results include: CBC and BMP are normal   Imaging Studies ordered:  I ordered imaging studies including CT orbits which shows likely dacryocystitis and periorbital cellulitis, no cellulitis I independently visualized and interpreted imaging within scope of identifying emergent findings  I agree with the radiologist interpretation     Problem List / ED Course / Critical interventions / Medication management  Periorbital cellulitis-patient was yesterday for eye  injection, drainage and prescribed antibiotics drops.  He has gotten progressively worse and now has swelling of his upper and lower lid.  When his lids are held open he is able to see well.  He does have some chemosis.  Due to increased pain CT ordered to rule out orbital cellulitis, likely periorbital cellulitis with dacryocystitis.  Will treat with Augmentin  and Bactrim orally as he well-appearing, feels better after Toradol .  Does not meet SIRS criteria.  Advised on warm compresses, continue drops because he still has some conjunctival irritation, and follow-up closely with ophthalmology.  He is given strict return precautions.  Also noted on CT was debris in his EACs, he has bilateral cerumen impaction, right greater than left, will give  Debrox and have him follow-up with his PCP.  Advised to not use Q-tips.  He is agreeable with plan of care and discharge.  I ordered medication including toradol   for pain  Reevaluation of the patient after these medicines showed that the patient improved I have reviewed the patients home medicines and have made adjustments as needed     Test / Admission - Considered:  CT-ordered Admission-not indicated at this time    Amount and/or Complexity of Data Reviewed Labs: ordered. Radiology: ordered.  Risk OTC drugs. Prescription drug management.           Final Clinical Impression(s) / ED Diagnoses Final diagnoses:  Periorbital cellulitis of left eye  Bilateral impacted cerumen    Rx / DC Orders ED Discharge Orders          Ordered    amoxicillin -clavulanate (AUGMENTIN ) 875-125 MG tablet  Every 12 hours        12/15/23 1334    sulfamethoxazole-trimethoprim (BACTRIM DS) 800-160 MG tablet  2 times daily        12/15/23 1334    naproxen (NAPROSYN) 500 MG tablet  2 times daily PRN        12/15/23 1334    carbamide peroxide (DEBROX) 6.5 % OTIC solution  2 times daily        12/15/23 [address], PA-C 12/15/23 1400    Sueellen Emery, MD 12/15/23 [phone]

## 2023-12-15 NOTE — ED Notes (Signed)
 Pt in CT.

## 2024-01-14 ENCOUNTER — Encounter (HOSPITAL_COMMUNITY): Payer: Self-pay | Admitting: Emergency Medicine

## 2024-01-14 ENCOUNTER — Emergency Department (HOSPITAL_COMMUNITY)
Admission: EM | Admit: 2024-01-14 | Discharge: 2024-01-15 | Disposition: A | Discharge status: Home / Self Care | Payer: Self-pay | Attending: Emergency Medicine | Admitting: Emergency Medicine

## 2024-01-14 ENCOUNTER — Other Ambulatory Visit: Payer: Self-pay

## 2024-01-14 ENCOUNTER — Emergency Department (HOSPITAL_COMMUNITY): Payer: Self-pay

## 2024-01-14 DIAGNOSIS — S0083XA Contusion of other part of head, initial encounter: Secondary | ICD-10-CM | POA: Insufficient documentation

## 2024-01-14 DIAGNOSIS — Y9355 Activity, bike riding: Secondary | ICD-10-CM | POA: Insufficient documentation

## 2024-01-14 DIAGNOSIS — Y92410 Unspecified street and highway as the place of occurrence of the external cause: Secondary | ICD-10-CM | POA: Insufficient documentation

## 2024-01-14 DIAGNOSIS — S060XAA Concussion with loss of consciousness status unknown, initial encounter: Secondary | ICD-10-CM | POA: Insufficient documentation

## 2024-01-14 MED ORDER — MELOXICAM 15 MG PO TABS
15.0000 mg | ORAL_TABLET | Freq: Every day | ORAL | 0 refills | Status: AC
Start: 2024-01-14 — End: 2024-01-28

## 2024-01-14 MED ORDER — ONDANSETRON HCL 4 MG PO TABS: 4.0000 mg | ORAL_TABLET | Freq: Four times a day (QID) | ORAL | 0 refills | Status: AC
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
## 2024-01-14 NOTE — Discharge Instructions (Signed)
 Your testing showed no broken bones or bleeding on the brain thankfully  Zofran  is a medication which can help with nausea.  You may take 4 mg by mouth every 6 hours as needed if you are an adult, if your child under the age of 6 take half of a tablet or 2 mg every 6 hours as needed.  This should dissolve on your tongue within a short timeframe.  Wait about 30 minutes after taking it to help with drinking clear liquids.  Please take Mobic ,  once daily as needed for pain - this in an antiinflammatory medicine (NSAID) and is similar to ibuprofen  - many people feel that it is stronger than ibuprofen  and it is easier to take since it is a smaller pill.  Please use this only for 1 week - if your pain persists, you will need to follow up with your doctor in the office for ongoing guidance and pain control.  Thank you for allowing us  to treat you in the emergency department today.  After reviewing your examination and potential testing that was done it appears that you are safe to go home.  I would like for you to follow-up with your doctor within the next several days, have them obtain your records and follow-up with them to review all potential tests and results from your visit.  If you should develop severe or worsening symptoms return to the emergency department immediately

## 2024-01-14 NOTE — ED Notes (Signed)
 Pt waiting on ride home
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
## 2024-01-14 NOTE — ED Notes (Signed)
 Patient transported to CT

## 2024-01-14 NOTE — ED Notes (Addendum)
Pt actively vomiting in triage 

## 2024-01-14 NOTE — ED Notes (Signed)
 ED Provider at bedside.

## 2024-04-12 ENCOUNTER — Other Ambulatory Visit: Payer: Self-pay

## 2024-04-12 ENCOUNTER — Encounter (HOSPITAL_COMMUNITY): Payer: Self-pay

## 2024-04-12 ENCOUNTER — Emergency Department (HOSPITAL_COMMUNITY)
Admission: EM | Admit: 2024-04-12 | Discharge: 2024-04-12 | Discharge status: Against Medical Advice | Payer: Self-pay | Attending: Emergency Medicine | Admitting: Emergency Medicine

## 2024-04-12 DIAGNOSIS — R519 Headache, unspecified: Secondary | ICD-10-CM | POA: Insufficient documentation

## 2024-04-12 DIAGNOSIS — Z5321 Procedure and treatment not carried out due to patient leaving prior to being seen by health care provider: Secondary | ICD-10-CM | POA: Insufficient documentation

## 2024-04-12 NOTE — ED Provider Notes (Signed)
 Patient left without being seen by a provider.   Nathan Sid SAILOR, MD 04/12/24 [phone]

## 2024-04-12 NOTE — ED Notes (Signed)
 Pt leaving AMA.

## 2024-04-12 NOTE — ED Triage Notes (Signed)
 Pt reports headache x 3days and having problems trying to focus now.
